# Patient Record
Sex: Male | Born: 1965 | Race: Black or African American | Hispanic: No | Marital: Single | Smoking: Current every day smoker
Health system: Southern US, Community
[De-identification: ages and names within clinical notes are randomized; demographics above are authoritative.]

## PROBLEM LIST (undated history)

## (undated) DIAGNOSIS — S0990XA Unspecified injury of head, initial encounter: Secondary | ICD-10-CM

## (undated) DIAGNOSIS — I10 Essential (primary) hypertension: Secondary | ICD-10-CM

## (undated) DIAGNOSIS — K219 Gastro-esophageal reflux disease without esophagitis: Secondary | ICD-10-CM

## (undated) HISTORY — DX: Essential (primary) hypertension: I10

## (undated) HISTORY — PX: HERNIA REPAIR: SHX51

## (undated) HISTORY — DX: Gastro-esophageal reflux disease without esophagitis: K21.9

## (undated) HISTORY — PX: TONSILLECTOMY: SUR1361

---

## 2005-07-16 ENCOUNTER — Inpatient Hospital Stay: Payer: Self-pay | Admitting: Surgery

## 2005-07-26 ENCOUNTER — Emergency Department: Payer: Self-pay | Admitting: Emergency Medicine

## 2007-07-13 ENCOUNTER — Emergency Department: Payer: Self-pay | Admitting: Emergency Medicine

## 2010-01-11 ENCOUNTER — Emergency Department (HOSPITAL_COMMUNITY): Admission: EM | Admit: 2010-01-11 | Discharge: 2010-01-11 | Payer: Self-pay | Admitting: Emergency Medicine

## 2010-11-24 ENCOUNTER — Emergency Department (HOSPITAL_COMMUNITY)
Admission: EM | Admit: 2010-11-24 | Discharge: 2010-11-24 | Payer: Self-pay | Source: Home / Self Care | Admitting: Emergency Medicine

## 2011-02-04 ENCOUNTER — Emergency Department (HOSPITAL_COMMUNITY)
Admission: EM | Admit: 2011-02-04 | Discharge: 2011-02-04 | Disposition: A | Discharge status: Home / Self Care | Payer: Self-pay | Attending: Emergency Medicine | Admitting: Emergency Medicine

## 2011-02-04 ENCOUNTER — Emergency Department (HOSPITAL_COMMUNITY): Payer: Self-pay

## 2011-02-04 DIAGNOSIS — S5000XA Contusion of unspecified elbow, initial encounter: Secondary | ICD-10-CM | POA: Insufficient documentation

## 2011-02-04 DIAGNOSIS — M25429 Effusion, unspecified elbow: Secondary | ICD-10-CM | POA: Insufficient documentation

## 2011-02-04 DIAGNOSIS — X58XXXA Exposure to other specified factors, initial encounter: Secondary | ICD-10-CM | POA: Insufficient documentation

## 2011-02-04 DIAGNOSIS — M25529 Pain in unspecified elbow: Secondary | ICD-10-CM | POA: Insufficient documentation

## 2011-02-04 DIAGNOSIS — Y929 Unspecified place or not applicable: Secondary | ICD-10-CM | POA: Insufficient documentation

## 2011-02-06 ENCOUNTER — Emergency Department (HOSPITAL_COMMUNITY)
Admission: EM | Admit: 2011-02-06 | Discharge: 2011-02-06 | Disposition: A | Discharge status: Home / Self Care | Payer: Medicaid Other | Attending: Emergency Medicine | Admitting: Emergency Medicine

## 2011-02-06 ENCOUNTER — Emergency Department (HOSPITAL_COMMUNITY): Payer: Medicaid Other

## 2011-02-06 DIAGNOSIS — R1032 Left lower quadrant pain: Secondary | ICD-10-CM | POA: Insufficient documentation

## 2011-02-06 DIAGNOSIS — R0789 Other chest pain: Secondary | ICD-10-CM | POA: Insufficient documentation

## 2011-02-06 DIAGNOSIS — Y92009 Unspecified place in unspecified non-institutional (private) residence as the place of occurrence of the external cause: Secondary | ICD-10-CM | POA: Insufficient documentation

## 2011-02-06 DIAGNOSIS — S301XXA Contusion of abdominal wall, initial encounter: Secondary | ICD-10-CM | POA: Insufficient documentation

## 2011-02-06 DIAGNOSIS — W010XXA Fall on same level from slipping, tripping and stumbling without subsequent striking against object, initial encounter: Secondary | ICD-10-CM | POA: Insufficient documentation

## 2011-02-22 ENCOUNTER — Other Ambulatory Visit: Payer: Self-pay | Admitting: General Surgery

## 2011-02-22 ENCOUNTER — Encounter (HOSPITAL_COMMUNITY): Payer: Medicaid Other

## 2011-02-22 DIAGNOSIS — Z01818 Encounter for other preprocedural examination: Secondary | ICD-10-CM | POA: Insufficient documentation

## 2011-02-22 DIAGNOSIS — Z01812 Encounter for preprocedural laboratory examination: Secondary | ICD-10-CM | POA: Insufficient documentation

## 2011-02-22 LAB — CBC
MCH: 30.3 pg (ref 26.0–34.0)
MCV: 90.8 fL (ref 78.0–100.0)
Platelets: 223 10*3/uL (ref 150–400)
RDW: 14.5 % (ref 11.5–15.5)

## 2011-02-22 LAB — BASIC METABOLIC PANEL
BUN: 7 mg/dL (ref 6–23)
CO2: 26 mEq/L (ref 19–32)
Chloride: 107 mEq/L (ref 96–112)
Glucose, Bld: 85 mg/dL (ref 70–99)
Potassium: 4.1 mEq/L (ref 3.5–5.1)

## 2011-02-22 LAB — DIFFERENTIAL
Eosinophils Absolute: 0.7 10*3/uL (ref 0.0–0.7)
Eosinophils Relative: 13 % — ABNORMAL HIGH (ref 0–5)
Lymphs Abs: 2.7 10*3/uL (ref 0.7–4.0)
Monocytes Relative: 6 % (ref 3–12)

## 2011-02-24 ENCOUNTER — Other Ambulatory Visit: Payer: Self-pay | Admitting: General Surgery

## 2011-02-24 ENCOUNTER — Inpatient Hospital Stay (HOSPITAL_COMMUNITY)
Admission: RE | Admit: 2011-02-24 | Discharge: 2011-02-25 | DRG: 352 | Disposition: A | Discharge status: Home / Self Care | Payer: Medicaid Other | Source: Ambulatory Visit | Attending: General Surgery | Admitting: General Surgery

## 2011-02-24 DIAGNOSIS — Z87820 Personal history of traumatic brain injury: Secondary | ICD-10-CM

## 2011-02-24 DIAGNOSIS — K4090 Unilateral inguinal hernia, without obstruction or gangrene, not specified as recurrent: Principal | ICD-10-CM | POA: Diagnosis present
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
## 2011-07-04 ENCOUNTER — Encounter: Payer: Self-pay | Admitting: *Deleted

## 2011-07-04 ENCOUNTER — Emergency Department (HOSPITAL_COMMUNITY)
Admission: EM | Admit: 2011-07-04 | Discharge: 2011-07-04 | Disposition: A | Discharge status: Home / Self Care | Payer: Medicaid Other | Attending: Emergency Medicine | Admitting: Emergency Medicine

## 2011-07-04 DIAGNOSIS — Z23 Encounter for immunization: Secondary | ICD-10-CM

## 2011-07-04 DIAGNOSIS — S1190XA Unspecified open wound of unspecified part of neck, initial encounter: Secondary | ICD-10-CM | POA: Insufficient documentation

## 2011-07-04 DIAGNOSIS — S335XXA Sprain of ligaments of lumbar spine, initial encounter: Secondary | ICD-10-CM | POA: Insufficient documentation

## 2011-07-04 DIAGNOSIS — F17200 Nicotine dependence, unspecified, uncomplicated: Secondary | ICD-10-CM | POA: Insufficient documentation

## 2011-07-04 MED ORDER — PREDNISONE 20 MG PO TABS: 60.0000 mg | ORAL_TABLET | Freq: Every day | ORAL | Status: AC

## 2011-07-04 MED ORDER — HYDROCODONE-ACETAMINOPHEN 5-325 MG PO TABS
1.0000 | ORAL_TABLET | Freq: Once | ORAL | Status: AC
  Administered 2011-07-04: 1 via ORAL
  Filled 2011-07-04: qty 1

## 2011-07-04 MED ORDER — PREDNISONE 20 MG PO TABS
60.0000 mg | ORAL_TABLET | Freq: Once | ORAL | Status: AC
  Administered 2011-07-04: 60 mg via ORAL
  Filled 2011-07-04: qty 3

## 2011-07-04 MED ORDER — HYDROCODONE-ACETAMINOPHEN 5-325 MG PO TABS: 1.0000 | ORAL_TABLET | ORAL | Status: AC | PRN

## 2011-07-04 MED ORDER — TETANUS-DIPHTH-ACELL PERTUSSIS 5-2.5-18.5 LF-MCG/0.5 IM SUSP
0.5000 mL | Freq: Once | INTRAMUSCULAR | Status: AC
  Administered 2011-07-04: 0.5 mL via INTRAMUSCULAR
  Filled 2011-07-04: qty 0.5
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
## 2011-10-19 ENCOUNTER — Emergency Department (HOSPITAL_COMMUNITY)
Admission: EM | Admit: 2011-10-19 | Discharge: 2011-10-19 | Disposition: A | Discharge status: Home / Self Care | Payer: Medicaid Other | Attending: Emergency Medicine | Admitting: Emergency Medicine

## 2011-10-19 ENCOUNTER — Encounter (HOSPITAL_COMMUNITY): Payer: Self-pay | Admitting: *Deleted

## 2011-10-19 ENCOUNTER — Emergency Department (HOSPITAL_COMMUNITY): Payer: Medicaid Other

## 2011-10-19 DIAGNOSIS — S01409A Unspecified open wound of unspecified cheek and temporomandibular area, initial encounter: Secondary | ICD-10-CM | POA: Insufficient documentation

## 2011-10-19 DIAGNOSIS — S022XXA Fracture of nasal bones, initial encounter for closed fracture: Secondary | ICD-10-CM | POA: Insufficient documentation

## 2011-10-19 DIAGNOSIS — S0100XA Unspecified open wound of scalp, initial encounter: Secondary | ICD-10-CM | POA: Insufficient documentation

## 2011-10-19 DIAGNOSIS — F17200 Nicotine dependence, unspecified, uncomplicated: Secondary | ICD-10-CM | POA: Insufficient documentation

## 2011-10-19 DIAGNOSIS — S1093XA Contusion of unspecified part of neck, initial encounter: Secondary | ICD-10-CM | POA: Insufficient documentation

## 2011-10-19 DIAGNOSIS — S0003XA Contusion of scalp, initial encounter: Secondary | ICD-10-CM | POA: Insufficient documentation

## 2011-10-19 HISTORY — DX: Unspecified injury of head, initial encounter: S09.90XA

## 2011-10-19 MED ORDER — LIDOCAINE-EPINEPHRINE (PF) 2 %-1:200000 IJ SOLN
INTRAMUSCULAR | Status: AC
  Administered 2011-10-19: 22:00:00
  Filled 2011-10-19: qty 20

## 2011-10-19 MED ORDER — HYDROCODONE-ACETAMINOPHEN 5-325 MG PO TABS: 2.0000 | ORAL_TABLET | ORAL | Status: AC | PRN

## 2011-10-19 NOTE — ED Notes (Signed)
Pt left the er stating no needs 
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
## 2011-10-28 ENCOUNTER — Encounter (HOSPITAL_COMMUNITY): Payer: Self-pay | Admitting: *Deleted

## 2011-10-28 ENCOUNTER — Emergency Department (HOSPITAL_COMMUNITY)
Admission: EM | Admit: 2011-10-28 | Discharge: 2011-10-28 | Disposition: A | Discharge status: Home / Self Care | Payer: Medicaid Other | Attending: Emergency Medicine | Admitting: Emergency Medicine

## 2011-10-28 DIAGNOSIS — Z4802 Encounter for removal of sutures: Secondary | ICD-10-CM | POA: Insufficient documentation

## 2011-10-28 NOTE — ED Notes (Signed)
Pt alert and oriented x 4 and respirations even and unlabored.  Discharge instructions reviewed with patient and patient verbalized understanding.  Pt ambulated with steady gait to POV.  No acute distress noted.

## 2011-10-28 NOTE — ED Provider Notes (Signed)
History     CSN: [id number] Arrival date & time: 10/28/2011  8:11 AM   None     Chief Complaint  Patient presents with  . Suture / Staple Removal    (Consider location/radiation/quality/duration/timing/severity/associated sxs/prior treatment) Patient is a 45 y.o. male presenting with suture removal. The history is provided by the patient. No language interpreter was used.  Suture / Staple Removal  The sutures were placed 7 to 10 days ago. Treatments since wound repair include regular soap and water washings. His temperature was unmeasured prior to arrival. There has been no drainage from the wound. There is no redness present. There is no swelling present. The pain has no pain.    Past Medical History  Diagnosis Date  . Head injury     Past Surgical History  Procedure Date  . Hernia repair     History reviewed. No pertinent family history.  History  Substance Use Topics  . Smoking status: Current Everyday Smoker -- 0.5 packs/day for 10 years  . Smokeless tobacco: Not on file  . Alcohol Use: Yes     occasional per pt      Review of Systems  Skin: Positive for wound.  All other systems reviewed and are negative.    Allergies  Review of patient's allergies indicates no known allergies.  Home Medications   Current Outpatient Rx  Name Route Sig Dispense Refill  . HYDROCODONE-ACETAMINOPHEN 5-325 MG PO TABS Oral Take 2 tablets by mouth every 4 (four) hours as needed for pain. 10 tablet 0  . IBUPROFEN 800 MG PO TABS Oral Take 800 mg by mouth every 8 (eight) hours as needed. For pain       BP 131/81  Pulse 66  Temp(Src) 97.9 F (36.6 C) (Oral)  Resp 16  Ht 5\' 6"  (1.676 m)  Wt 175 lb (79.379 kg)  BMI 28.25 kg/m2  SpO2 99%  Physical Exam  Nursing note and vitals reviewed. Constitutional: He is oriented to person, place, and time. He appears well-developed and well-nourished. He is cooperative.  HENT:  Head: Normocephalic.    Eyes: EOM are normal.    Neck: Normal range of motion.  Cardiovascular: Normal rate, regular rhythm, normal heart sounds and intact distal pulses.   Pulmonary/Chest: Effort normal and breath sounds normal. No respiratory distress.  Abdominal: Soft. He exhibits no distension. There is no tenderness.  Musculoskeletal: Normal range of motion.  Neurological: He is alert and oriented to person, place, and time. He has normal strength. No cranial nerve deficit or sensory deficit. GCS eye subscore is 4. GCS verbal subscore is 5. GCS motor subscore is 6.  Skin: Skin is warm and dry.  Psychiatric: He has a normal mood and affect. Judgment normal.    ED Course  Procedures (including critical care time)  Labs Reviewed - No data to display No results found.   No diagnosis found.    MDM          Worthy Rancher, PA 10/28/11 [phone]

## 2011-10-28 NOTE — ED Notes (Signed)
Pt needs staples removed from his head. States that they were placed on Nov. 1, 2012.

## 2011-10-29 NOTE — ED Provider Notes (Signed)
Medical screening examination/treatment/procedure(s) were performed by non-physician practitioner and as supervising physician I was immediately available for consultation/collaboration.   Keondra Haydu, MD 10/29/11 0753 

## 2012-03-29 ENCOUNTER — Encounter (HOSPITAL_COMMUNITY): Payer: Self-pay | Admitting: Emergency Medicine

## 2012-03-29 ENCOUNTER — Emergency Department (HOSPITAL_COMMUNITY)
Admission: EM | Admit: 2012-03-29 | Discharge: 2012-03-29 | Disposition: A | Discharge status: Home / Self Care | Payer: Medicaid Other | Attending: Emergency Medicine | Admitting: Emergency Medicine

## 2012-03-29 DIAGNOSIS — IMO0002 Reserved for concepts with insufficient information to code with codable children: Secondary | ICD-10-CM | POA: Insufficient documentation

## 2012-03-29 DIAGNOSIS — H5789 Other specified disorders of eye and adnexa: Secondary | ICD-10-CM | POA: Insufficient documentation

## 2012-03-29 DIAGNOSIS — F17200 Nicotine dependence, unspecified, uncomplicated: Secondary | ICD-10-CM | POA: Insufficient documentation

## 2012-03-29 DIAGNOSIS — H5710 Ocular pain, unspecified eye: Secondary | ICD-10-CM | POA: Insufficient documentation

## 2012-03-29 DIAGNOSIS — T2610XA Burn of cornea and conjunctival sac, unspecified eye, initial encounter: Secondary | ICD-10-CM | POA: Insufficient documentation

## 2012-03-29 DIAGNOSIS — T2660XA Corrosion of cornea and conjunctival sac, unspecified eye, initial encounter: Secondary | ICD-10-CM

## 2012-03-29 MED ORDER — ERYTHROMYCIN 5 MG/GM OP OINT
TOPICAL_OINTMENT | Freq: Once | OPHTHALMIC | Status: AC
  Administered 2012-03-29: 14:00:00 via OPHTHALMIC
  Filled 2012-03-29: qty 3.5

## 2012-03-29 MED ORDER — KETOROLAC TROMETHAMINE 0.5 % OP SOLN
1.0000 [drp] | Freq: Once | OPHTHALMIC | Status: AC
  Administered 2012-03-29: 1 [drp] via OPHTHALMIC
  Filled 2012-03-29: qty 5

## 2012-03-29 MED ORDER — FLUORESCEIN SODIUM 1 MG OP STRP
ORAL_STRIP | OPHTHALMIC | Status: AC
  Administered 2012-03-29: 12:00:00
  Filled 2012-03-29: qty 2

## 2012-03-29 MED ORDER — PREDNISOLONE ACETATE 1 % OP SUSP
1.0000 [drp] | Freq: Once | OPHTHALMIC | Status: AC
  Administered 2012-03-29: 1 [drp] via OPHTHALMIC
  Filled 2012-03-29: qty 1

## 2012-03-29 MED ORDER — TETRACAINE HCL 0.5 % OP SOLN
OPHTHALMIC | Status: AC
  Administered 2012-03-29: 12:00:00 via OPHTHALMIC
  Filled 2012-03-29: qty 2

## 2012-03-29 NOTE — Discharge Instructions (Signed)
RESOURCE GUIDE  Dental Problems  Patients with Medicaid: [facility]                     [facility] [address]                                           [address] Phone:  [phone]                                                  Phone:  [phone]  If unable to pay or uninsured, contact:  [facility] or [facility] to become qualified for the adult dental clinic.  Chronic Pain Problems Contact [facility]  [phone] Patients need to be referred by their primary care doctor.  Insufficient Money for Medicine Contact [employer]:  call "211" or [facility] 271-5999.  No Primary Care Doctor Call [facility]  832-8000 Other agencies that provide inexpensive medical care    [facility]  832-8035    [facility]  832-7272    [facility]  [phone]    Women's Clinic  832-4777    [facility]  [phone]    [facility]  [phone]  Psychological Services [facility]  [phone] [facility]  [phone] [facility]   800 853-5163 (emergency services 641-4993)  Substance Abuse Resources [facility]  336-882-2125 [facility] 336-784-9470 [facility] 336-285-9073 [facility] 336-845-3988 Residential & Outpatient Substance Abuse Program  800-659-3381  Abuse/Neglect [place] Child Abuse Hotline (336) 641-3795 [place] Child Abuse Hotline 800-378-5315 (After Hours)  Emergency Shelter [facility] (336) 271-5985  Maternity Homes [facility] (336) 275-9566 [facility] (704) 372-4663  MRSA Hotline #:   832-7006    [place] Resources  [facility] [place]     [employer]                          [facility] [address] [place]                       [address]      [address]  [place]                                                [place]                            [place] Phone:  [phone]                                   Phone:  [phone]                 Phone:  [phone]  [facility] Phone:  [phone]    [facility] Child Abuse Hotline (904)717-1004 364-616-2777 (After Hours)   Take the erythromycin eye ointment: 1 thin strip to left eye 4 times per day.  Take the ketoralac eye drops:  1 drop in your left eye 4 times per day.  Take the pred forte eye drops:  1 drop in you left eye 4 times per day.  Call the eye doctor today to confirm you will be seen in the office in follow up on Monday at 4pm.  It is VERY IMPORTANT you keep this appointment and take the medicines a prescribed to help your eye injury heal correctly.  Return to the Emergency Department immediately sooner if worsening.

## 2012-03-29 NOTE — ED Notes (Signed)
Pt c/o eye drainage, pain and redness since Tuesday.

## 2012-03-29 NOTE — ED Notes (Signed)
MD at bedside. 
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
## 2012-03-29 NOTE — ED Notes (Signed)
Discharge instructions reviewed with pt, questions answered. Pt verbalized understanding.  

## 2012-04-19 ENCOUNTER — Encounter (HOSPITAL_COMMUNITY): Payer: Self-pay | Admitting: *Deleted

## 2012-04-19 ENCOUNTER — Emergency Department (HOSPITAL_COMMUNITY): Payer: Medicaid Other

## 2012-04-19 ENCOUNTER — Emergency Department (HOSPITAL_COMMUNITY)
Admission: EM | Admit: 2012-04-19 | Discharge: 2012-04-19 | Disposition: A | Discharge status: Home / Self Care | Payer: Medicaid Other | Attending: Emergency Medicine | Admitting: Emergency Medicine

## 2012-04-19 DIAGNOSIS — M7989 Other specified soft tissue disorders: Secondary | ICD-10-CM | POA: Insufficient documentation

## 2012-04-19 DIAGNOSIS — S5000XA Contusion of unspecified elbow, initial encounter: Secondary | ICD-10-CM | POA: Insufficient documentation

## 2012-04-19 DIAGNOSIS — S5002XA Contusion of left elbow, initial encounter: Secondary | ICD-10-CM

## 2012-04-19 MED ORDER — IBUPROFEN 800 MG PO TABS
800.0000 mg | ORAL_TABLET | Freq: Once | ORAL | Status: AC
  Administered 2012-04-19: 800 mg via ORAL
  Filled 2012-04-19: qty 1
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
## 2012-04-19 NOTE — Discharge Instructions (Signed)
Contusion A contusion is a deep bruise. Contusions happen when an injury causes bleeding under the skin. Signs of bruising include pain, puffiness (swelling), and discolored skin. The contusion may turn blue, purple, or yellow. HOME CARE   Put ice on the injured area.   Put ice in a plastic bag.   Place a towel between your skin and the bag.   Leave the ice on for 15 to 20 minutes, 3 to 4 times a day.   Only take medicine as told by your doctor.   Rest the injured area.   If possible, raise (elevate) the injured area to lessen puffiness.  GET HELP RIGHT AWAY IF:   You have more bruising or puffiness.   You have pain that is getting worse.   Your puffiness or pain is not helped by medicine.  MAKE SURE YOU:   Understand these instructions.   Will watch your condition.   Will get help right away if you are not doing well or get worse.  Document Released: 05/22/2008 Document Revised: 11/23/2011 Document Reviewed: 10/09/2011 [facility] Patient Information 2012 [place], [place].Cryotherapy Cryotherapy means treatment with cold. Ice or gel packs can be used to reduce both pain and swelling. Ice is the most helpful within the first 24 to 48 hours after an injury or flareup from overusing a muscle or joint. Sprains, strains, spasms, burning pain, shooting pain, and aches can all be eased with ice. Ice can also be used when recovering from surgery. Ice is effective, has very few side effects, and is safe for most people to use. PRECAUTIONS  Ice is not a safe treatment option for people with:  Raynaud's phenomenon. This is a condition affecting small blood vessels in the extremities. Exposure to cold may cause your problems to return.   Cold hypersensitivity. There are many forms of cold hypersensitivity, including:   Cold urticaria. Red, itchy hives appear on the skin when the tissues begin to warm after being iced.   Cold erythema. This is a red, itchy rash caused by exposure to cold.     Cold hemoglobinuria. Red blood cells break down when the tissues begin to warm after being iced. The hemoglobin that carry oxygen are passed into the urine because they cannot combine with blood proteins fast enough.   Numbness or altered sensitivity in the area being iced.  If you have any of the following conditions, do not use ice until you have discussed cryotherapy with your caregiver:  Heart conditions, such as arrhythmia, angina, or chronic heart disease.   High blood pressure.   Healing wounds or open skin in the area being iced.   Current infections.   Rheumatoid arthritis.   Poor circulation.   Diabetes.  Ice slows the blood flow in the region it is applied. This is beneficial when trying to stop inflamed tissues from spreading irritating chemicals to surrounding tissues. However, if you expose your skin to cold temperatures for too long or without the proper protection, you can damage your skin or nerves. Watch for signs of skin damage due to cold. HOME CARE INSTRUCTIONS Follow these tips to use ice and cold packs safely.  Place a dry or damp towel between the ice and skin. A damp towel will cool the skin more quickly, so you may need to shorten the time that the ice is used.   For a more rapid response, add gentle compression to the ice.   Ice for no more than 10 to 20 minutes at  a time. The bonier the area you are icing, the less time it will take to get the benefits of ice.   Check your skin after 5 minutes to make sure there are no signs of a poor response to cold or skin damage.   Rest 20 minutes or more in between uses.   Once your skin is numb, you can end your treatment. You can test numbness by very lightly touching your skin. The touch should be so light that you do not see the skin dimple from the pressure of your fingertip. When using ice, most people will feel these normal sensations in this order: cold, burning, aching, and numbness.   Do not use ice on  someone who cannot communicate their responses to pain, such as small children or people with dementia.  HOW TO MAKE AN ICE PACK Ice packs are the most common way to use ice therapy. Other methods include ice massage, ice baths, and cryo-sprays. Muscle creams that cause a cold, tingly feeling do not offer the same benefits that ice offers and should not be used as a substitute unless recommended by your caregiver. To make an ice pack, do one of the following:  Place crushed ice or a bag of frozen vegetables in a sealable plastic bag. Squeeze out the excess air. Place this bag inside another plastic bag. Slide the bag into a pillowcase or place a damp towel between your skin and the bag.   Mix 3 parts water with 1 part rubbing alcohol. Freeze the mixture in a sealable plastic bag. When you remove the mixture from the freezer, it will be slushy. Squeeze out the excess air. Place this bag inside another plastic bag. Slide the bag into a pillowcase or place a damp towel between your skin and the bag.  SEEK MEDICAL CARE IF:  You develop white spots on your skin. This may give the skin a blotchy (mottled) appearance.   Your skin turns blue or pale.   Your skin becomes waxy or hard.   Your swelling gets worse.  MAKE SURE YOU:   Understand these instructions.   Will watch your condition.   Will get help right away if you are not doing well or get worse.  Document Released: 07/31/2011 Document Revised: 11/23/2011 Document Reviewed: 07/31/2011 [facility] Patient Information 2012 [place], [place].  Take ibuprofen 800 mg every 8 hrs with food.  Wear the sling for comfort.  Apply ice several times daily.  Follow up with either of the orthopedists as needed.
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
## 2012-04-20 NOTE — ED Provider Notes (Signed)
Medical screening examination/treatment/procedure(s) were performed by non-physician practitioner and as supervising physician I was immediately available for consultation/collaboration.   Joya Gaskins, MD 04/20/12 [phone]
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
## 2013-03-07 ENCOUNTER — Emergency Department (HOSPITAL_COMMUNITY)
Admission: EM | Admit: 2013-03-07 | Discharge: 2013-03-07 | Disposition: A | Discharge status: Home / Self Care | Payer: Medicaid Other | Attending: Emergency Medicine | Admitting: Emergency Medicine

## 2013-03-07 ENCOUNTER — Encounter (HOSPITAL_COMMUNITY): Payer: Self-pay | Admitting: *Deleted

## 2013-03-07 DIAGNOSIS — K029 Dental caries, unspecified: Secondary | ICD-10-CM | POA: Insufficient documentation

## 2013-03-07 DIAGNOSIS — F17200 Nicotine dependence, unspecified, uncomplicated: Secondary | ICD-10-CM | POA: Insufficient documentation

## 2013-03-07 DIAGNOSIS — R05 Cough: Secondary | ICD-10-CM | POA: Insufficient documentation

## 2013-03-07 DIAGNOSIS — R599 Enlarged lymph nodes, unspecified: Secondary | ICD-10-CM | POA: Insufficient documentation

## 2013-03-07 DIAGNOSIS — K047 Periapical abscess without sinus: Secondary | ICD-10-CM | POA: Insufficient documentation

## 2013-03-07 DIAGNOSIS — Z8781 Personal history of (healed) traumatic fracture: Secondary | ICD-10-CM | POA: Insufficient documentation

## 2013-03-07 DIAGNOSIS — R059 Cough, unspecified: Secondary | ICD-10-CM | POA: Insufficient documentation

## 2013-03-07 MED ORDER — IBUPROFEN 600 MG PO TABS: 600.0000 mg | ORAL_TABLET | Freq: Four times a day (QID) | ORAL | Status: DC | PRN

## 2013-03-07 MED ORDER — TRAMADOL HCL 50 MG PO TABS: 50.0000 mg | ORAL_TABLET | Freq: Four times a day (QID) | ORAL | Status: DC | PRN

## 2013-03-07 MED ORDER — AMOXICILLIN 500 MG PO CAPS: 500.0000 mg | ORAL_CAPSULE | Freq: Three times a day (TID) | ORAL | Status: DC

## 2013-03-07 NOTE — ED Provider Notes (Signed)
Medical screening examination/treatment/procedure(s) were performed by non-physician practitioner and as supervising physician I was immediately available for consultation/collaboration.  Gloria Ricardo M Mikylah Ackroyd, MD 03/07/13 2138 
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
## 2013-03-07 NOTE — ED Notes (Signed)
Instructions, prescriptions and f/u information for dental resources provided/reviewed; verbalizes understanding.

## 2013-03-07 NOTE — ED Notes (Signed)
Dental abscess to left jaw x 2 days.

## 2013-03-10 ENCOUNTER — Emergency Department (HOSPITAL_COMMUNITY)
Admission: EM | Admit: 2013-03-10 | Discharge: 2013-03-10 | Disposition: A | Discharge status: Home / Self Care | Payer: Medicaid Other | Attending: Emergency Medicine | Admitting: Emergency Medicine

## 2013-03-10 ENCOUNTER — Encounter (HOSPITAL_COMMUNITY): Payer: Self-pay | Admitting: *Deleted

## 2013-03-10 DIAGNOSIS — Z87828 Personal history of other (healed) physical injury and trauma: Secondary | ICD-10-CM | POA: Insufficient documentation

## 2013-03-10 DIAGNOSIS — F17200 Nicotine dependence, unspecified, uncomplicated: Secondary | ICD-10-CM | POA: Insufficient documentation

## 2013-03-10 DIAGNOSIS — R21 Rash and other nonspecific skin eruption: Secondary | ICD-10-CM | POA: Insufficient documentation

## 2013-03-10 DIAGNOSIS — Z789 Other specified health status: Secondary | ICD-10-CM

## 2013-03-10 DIAGNOSIS — K029 Dental caries, unspecified: Secondary | ICD-10-CM | POA: Insufficient documentation

## 2013-03-10 DIAGNOSIS — T394X5A Adverse effect of antirheumatics, not elsewhere classified, initial encounter: Secondary | ICD-10-CM | POA: Insufficient documentation

## 2013-03-10 DIAGNOSIS — T360X5A Adverse effect of penicillins, initial encounter: Secondary | ICD-10-CM | POA: Insufficient documentation

## 2013-03-10 DIAGNOSIS — T3995XA Adverse effect of unspecified nonopioid analgesic, antipyretic and antirheumatic, initial encounter: Secondary | ICD-10-CM | POA: Insufficient documentation

## 2013-03-10 DIAGNOSIS — R220 Localized swelling, mass and lump, head: Secondary | ICD-10-CM | POA: Insufficient documentation

## 2013-03-10 DIAGNOSIS — R221 Localized swelling, mass and lump, neck: Secondary | ICD-10-CM | POA: Insufficient documentation

## 2013-03-10 MED ORDER — HYDROCODONE-ACETAMINOPHEN 5-325 MG PO TABS: ORAL_TABLET | ORAL | Status: DC

## 2013-03-10 MED ORDER — DIPHENHYDRAMINE HCL 25 MG PO CAPS: 25.0000 mg | ORAL_CAPSULE | ORAL | Status: DC | PRN

## 2013-03-10 MED ORDER — DIPHENHYDRAMINE HCL 25 MG PO CAPS
25.0000 mg | ORAL_CAPSULE | Freq: Once | ORAL | Status: AC
  Administered 2013-03-10: 25 mg via ORAL
  Filled 2013-03-10: qty 1

## 2013-03-10 MED ORDER — CLINDAMYCIN HCL 300 MG PO CAPS: 300.0000 mg | ORAL_CAPSULE | Freq: Four times a day (QID) | ORAL | Status: DC

## 2013-03-10 NOTE — ED Notes (Signed)
Pt was seen here for dental problem and started antibiotic and pain meds. Now reports itching rash . Seen here 3/21.

## 2013-03-10 NOTE — ED Notes (Signed)
Seen here x 4 days ago, received rx for abx and pain.  States started having allergic reaction with rash to back, arms, and face, with swelling x 2 days ago.  Denies difficulty breathing.

## 2013-03-10 NOTE — ED Provider Notes (Signed)
History     CSN: [id number]  Arrival date & time 03/10/13  1110   First MD Initiated Contact with Patient 03/10/13 1133      Chief Complaint  Patient presents with  . Allergic Reaction    (Consider location/radiation/quality/duration/timing/severity/associated sxs/prior treatment) HPI Comments: Patient c/o red, itchy rash that began two days after starting amoxil, ultram and ibuprofen.  He was seen here and treated with those medications for dental pain.  He also states that he has noticed some swelling ot his face.  He denies any swelling of his lips or tongue.  He also denies difficulty swallowing or breathing.  He stopped taking the medication last evening.  He denies taking any Benadryl or applying any OTC anti-itch creams  Patient is a 47 y.o. male presenting with rash. The history is provided by the patient.  Rash Location:  Face, shoulder/arm and torso Facial rash location:  Face Shoulder/arm rash location:  L upper arm and R upper arm Torso rash location:  Upper back Quality: itchiness, redness and swelling   Quality: not blistering, not draining, not painful and not peeling   Severity:  Mild Onset quality:  Gradual Duration:  2 days Timing:  Constant Progression:  Unchanged Chronicity:  New Context: medications   Relieved by:  Nothing Worsened by:  Nothing tried Ineffective treatments:  None tried Associated symptoms: no fever, no headaches, no hoarse voice, no joint pain, no nausea, no periorbital edema, no shortness of breath, no sore throat, no throat swelling, no tongue swelling, not vomiting and not wheezing     Past Medical History  Diagnosis Date  . Head injury     Past Surgical History  Procedure Laterality Date  . Hernia repair      No family history on file.  History  Substance Use Topics  . Smoking status: Current Every Day Smoker -- 0.50 packs/day for 10 years    Types: Cigarettes  . Smokeless tobacco: Not on file  . Alcohol Use: Yes   Comment: occasional per pt      Review of Systems  Constitutional: Negative for fever, chills, activity change and appetite change.  HENT: Positive for facial swelling and dental problem. Negative for congestion, sore throat, hoarse voice, trouble swallowing, neck pain and neck stiffness.   Eyes: Negative for visual disturbance.  Respiratory: Negative for chest tightness, shortness of breath and wheezing.   Cardiovascular: Negative for chest pain and palpitations.  Gastrointestinal: Negative for nausea and vomiting.  Musculoskeletal: Negative for arthralgias.  Skin: Positive for rash. Negative for wound.  Neurological: Negative for dizziness, facial asymmetry, weakness, numbness and headaches.  All other systems reviewed and are negative.    Allergies  Review of patient's allergies indicates no known allergies.  Home Medications   Current Outpatient Rx  Name  Route  Sig  Dispense  Refill  . amoxicillin (AMOXIL) 500 MG capsule   Oral   Take 1 capsule (500 mg total) by mouth 3 (three) times daily.   21 capsule   0   . ibuprofen (ADVIL,MOTRIN) 600 MG tablet   Oral   Take 1 tablet (600 mg total) by mouth every 6 (six) hours as needed for pain.   30 tablet   0   . traMADol (ULTRAM) 50 MG tablet   Oral   Take 1 tablet (50 mg total) by mouth every 6 (six) hours as needed for pain.   15 tablet   0     BP 125/78  Pulse 85  Temp(Src) 98.2 F (36.8 C) (Oral)  Resp 20  Ht 5\' 6"  (1.676 m)  Wt 175 lb (79.379 kg)  BMI 28.26 kg/m2  SpO2 100%  Physical Exam  Nursing note and vitals reviewed. Constitutional: He is oriented to person, place, and time. He appears well-developed and well-nourished. No distress.  HENT:  Head: Normocephalic and atraumatic.  Mouth/Throat: Oropharynx is clear and moist.  Multiple dental caries.  No obvious dental abscess. No trismus, no angioedema or noticeable facial edema  Eyes: Conjunctivae and EOM are normal. Pupils are equal, round, and  reactive to light.  Neck: Normal range of motion. Neck supple.  Cardiovascular: Normal rate, regular rhythm, normal heart sounds and intact distal pulses.   No murmur heard. Pulmonary/Chest: Effort normal and breath sounds normal. No respiratory distress. He has no wheezes.  Musculoskeletal: He exhibits no edema and no tenderness.  Lymphadenopathy:    He has no cervical adenopathy.  Neurological: He is alert and oriented to person, place, and time. He exhibits normal muscle tone. Coordination normal.  Skin: Rash noted. There is erythema.  two patches of erythema of the upper back and slight erythema of the face. Mild excoriation to the bilateral forearms and upper back.   No hives    ED Course  Procedures (including critical care time)  Labs Reviewed - No data to display No results found.      MDM    Previous ED chart reviewed.  Will have pt d/c the ibuprofen, amoxil and tramadol.  No palpable rash or hives at this time.  Airway patent, no facial edema.  Appears stable for discharge.  Patient has dental f/u and multiple dental caries, so I will prescribe clindamycin and benadryl. Patient also requesting something for dental pain, so #12 norco also prescribed.  He agrees to return here if the sx's worsen        Orange Hilligoss L. Jefrey Raburn, PA-C 03/12/13 1332

## 2013-03-13 NOTE — ED Provider Notes (Signed)
Medical screening examination/treatment/procedure(s) were performed by non-physician practitioner and as supervising physician I was immediately available for consultation/collaboration.  Christopher J. Pollina, MD 03/13/13 0705 

## 2019-10-28 ENCOUNTER — Other Ambulatory Visit: Payer: Self-pay

## 2019-10-28 DIAGNOSIS — Z20822 Contact with and (suspected) exposure to covid-19: Secondary | ICD-10-CM

## 2019-10-31 ENCOUNTER — Ambulatory Visit: Payer: Self-pay

## 2019-10-31 LAB — NOVEL CORONAVIRUS, NAA: SARS-CoV-2, NAA: NOT DETECTED

## 2019-10-31 NOTE — Telephone Encounter (Signed)
Provided covided -19 test results .   Provided care advice voiced understanding.

## 2021-05-25 ENCOUNTER — Encounter: Payer: Self-pay | Admitting: *Deleted

## 2021-06-30 ENCOUNTER — Other Ambulatory Visit: Payer: Self-pay

## 2021-06-30 ENCOUNTER — Ambulatory Visit: Payer: Medicaid Other | Admitting: Internal Medicine

## 2021-06-30 ENCOUNTER — Encounter: Payer: Self-pay | Admitting: *Deleted

## 2021-06-30 ENCOUNTER — Encounter: Payer: Self-pay | Admitting: Internal Medicine

## 2021-06-30 VITALS — BP 118/74 | HR 64 | Temp 97.3°F | Ht 66.0 in | Wt 191.4 lb

## 2021-06-30 DIAGNOSIS — Z789 Other specified health status: Secondary | ICD-10-CM

## 2021-06-30 DIAGNOSIS — Z1211 Encounter for screening for malignant neoplasm of colon: Secondary | ICD-10-CM
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
## 2021-06-30 NOTE — Patient Instructions (Signed)
We will schedule you for colonoscopy for colon cancer screening purposes today in clinic.  Further recommendations to follow.  It was nice meeting you today.  At [facility] we value your feedback. You may receive a survey about your visit today. Please share your experience as we strive to create trusting relationships with our patients to provide genuine, compassionate, quality care.  We appreciate your understanding and patience as we review any laboratory studies, imaging, and other diagnostic tests that are ordered as we care for you. Our office policy is 5 business days for review of these results, and any emergent or urgent results are addressed in a timely manner for your best interest. If you do not hear from our office in 1 week, please contact us.   We also encourage the use of MyChart, which contains your medical information for your review as well. If you are not enrolled in this feature, an access code is on this after visit summary for your convenience. Thank you for allowing [place] to be involved in your care.  It was great to see you today!  I hope you have a great rest of your summer!!    Hennie Duos. Marletta Lor, D.O. Gastroenterology and Hepatology [facility]

## 2021-07-01 ENCOUNTER — Telehealth: Payer: Self-pay | Admitting: *Deleted

## 2021-07-01 MED ORDER — CLENPIQ 10-3.5-12 MG-GM -GM/160ML PO SOLN: 1.0000 | Freq: Once | ORAL | 0 refills | Status: AC

## 2021-07-01 NOTE — Telephone Encounter (Signed)
Called pt. He has been scheduled for 8/8 at 7:30am. Aware will need pre-op appt prior. Advised will mail prep instructions. Confirmed address. Confirmed pharmacy. Aware Rx will be sent.

## 2021-07-04 ENCOUNTER — Encounter: Payer: Self-pay | Admitting: *Deleted

## 2021-07-11 ENCOUNTER — Encounter: Payer: Self-pay | Admitting: Internal Medicine

## 2021-07-11 ENCOUNTER — Telehealth: Payer: Self-pay | Admitting: Internal Medicine

## 2021-07-11 MED ORDER — CLENPIQ 10-3.5-12 MG-GM -GM/160ML PO SOLN: 1.0000 | Freq: Once | ORAL | 0 refills | Status: DC

## 2021-07-11 NOTE — Addendum Note (Signed)
Addended by: Armstead Peaks on: 07/11/2021 02:45 PM   Modules accepted: Orders

## 2021-07-11 NOTE — Addendum Note (Signed)
Addended by: Armstead Peaks on: 07/11/2021 02:57 PM   Modules accepted: Orders

## 2021-07-11 NOTE — Addendum Note (Signed)
Addended by: Armstead Peaks on: 07/11/2021 04:31 PM   Modules accepted: Orders

## 2021-07-11 NOTE — Telephone Encounter (Signed)
PLEASE MAKE SURE PATIENT PREP IS SENT TO [facility] IN [place]

## 2021-07-12 MED ORDER — CLENPIQ 10-3.5-12 MG-GM -GM/160ML PO SOLN: 1.0000 | Freq: Once | ORAL | 0 refills | Status: DC

## 2021-07-12 MED ORDER — CLENPIQ 10-3.5-12 MG-GM -GM/160ML PO SOLN: 1.0000 | Freq: Once | ORAL | 0 refills | Status: AC

## 2021-07-12 NOTE — Addendum Note (Signed)
Addended by: Armstead Peaks on: 07/12/2021 07:45 AM   Modules accepted: Orders

## 2021-07-12 NOTE — Telephone Encounter (Signed)
Rx faxed to pharmacy  

## 2021-07-18 NOTE — Patient Instructions (Signed)
Shane West  07/18/2021     @PREFPERIOPPHARMACY @   Your procedure is scheduled on  07/25/2021.   Report to 09/24/2021 at  [phone] A.M.   Call this number if you have problems the morning of surgery:  [phone]   Remember:  Follow the diet and prep instructions given to you by the office.    Take these medicines the morning of surgery with A SIP OF WATER                                         None     Do not wear jewelry, make-up or nail polish.  Do not wear lotions, powders, or perfumes, or deodorant.  Do not shave 48 hours prior to surgery.  Men may shave face and neck.  Do not bring valuables to the hospital.  [facility] is not responsible for any belongings or valuables.  Contacts, dentures or bridgework may not be worn into surgery.  Leave your suitcase in the car.  After surgery it may be brought to your room.  For patients admitted to the hospital, discharge time will be determined by your treatment team.  Patients discharged the day of surgery will not be allowed to drive home and must have someone with them for 24 hours.    Special instructions:    DO NOT smoke tobacco or vape for 24 hours before your procedure.  Please read over the following fact sheets that you were given. Anesthesia Post-op Instructions and Care and Recovery After Surgery      Colonoscopy, Adult, Care After This sheet gives you information about how to care for yourself after your procedure. Your health care provider may also give you more specific instructions. If you have problems or questions, contact your health careprovider. What can I expect after the procedure? After the procedure, it is common to have: A small amount of blood in your stool for 24 hours after the procedure. Some gas. Mild cramping or bloating of your abdomen. Follow these instructions at home: Eating and drinking  Drink enough fluid to keep your urine pale yellow. Follow instructions from your  health care provider about eating or drinking restrictions. Resume your normal diet as instructed by your health care provider. Avoid heavy or fried foods that are hard to digest.  Activity Rest as told by your health care provider. Avoid sitting for a long time without moving. Get up to take short walks every 1-2 hours. This is important to improve blood flow and breathing. Ask for help if you feel weak or unsteady. Return to your normal activities as told by your health care provider. Ask your health care provider what activities are safe for you. Managing cramping and bloating  Try walking around when you have cramps or feel bloated. Apply heat to your abdomen as told by your health care provider. Use the heat source that your health care provider recommends, such as a moist heat pack or a heating pad. Place a towel between your skin and the heat source. Leave the heat on for 20-30 minutes. Remove the heat if your skin turns bright red. This is especially important if you are unable to feel pain, heat, or cold. You may have a greater risk of getting burned.  General instructions If you were given a sedative during the procedure, it can affect  you for several hours. Do not drive or operate machinery until your health care provider says that it is safe. For the first 24 hours after the procedure: Do not sign important documents. Do not drink alcohol. Do your regular daily activities at a slower pace than normal. Eat soft foods that are easy to digest. Take over-the-counter and prescription medicines only as told by your health care provider. Keep all follow-up visits as told by your health care provider. This is important. Contact a health care provider if: You have blood in your stool 2-3 days after the procedure. Get help right away if you have: More than a small spotting of blood in your stool. Large blood clots in your stool. Swelling of your abdomen. Nausea or vomiting. A  fever. Increasing pain in your abdomen that is not relieved with medicine. Summary After the procedure, it is common to have a small amount of blood in your stool. You may also have mild cramping and bloating of your abdomen. If you were given a sedative during the procedure, it can affect you for several hours. Do not drive or operate machinery until your health care provider says that it is safe. Get help right away if you have a lot of blood in your stool, nausea or vomiting, a fever, or increased pain in your abdomen. This information is not intended to replace advice given to you by your health care provider. Make sure you discuss any questions you have with your healthcare provider. Document Revised: 11/28/2019 Document Reviewed: 06/30/2019 Elsevier Patient Education  [place] After This sheet gives you information about how to care for yourself after your procedure. Your health care provider may also give you more specific instructions. If you have problems or questions, contact your health careprovider. What can I expect after the procedure? After the procedure, it is common to have: Tiredness. Forgetfulness about what happened after the procedure. Impaired judgment for important decisions. Nausea or vomiting. Some difficulty with balance. Follow these instructions at home: For the time period you were told by your health care provider:     Rest as needed. Do not participate in activities where you could fall or become injured. Do not drive or use machinery. Do not drink alcohol. Do not take sleeping pills or medicines that cause drowsiness. Do not make important decisions or sign legal documents. Do not take care of children on your own. Eating and drinking Follow the diet that is recommended by your health care provider. Drink enough fluid to keep your urine pale yellow. If you vomit: Drink water, juice, or soup when you can drink  without vomiting. Make sure you have little or no nausea before eating solid foods. General instructions Have a responsible adult stay with you for the time you are told. It is important to have someone help care for you until you are awake and alert. Take over-the-counter and prescription medicines only as told by your health care provider. If you have sleep apnea, surgery and certain medicines can increase your risk for breathing problems. Follow instructions from your health care provider about wearing your sleep device: Anytime you are sleeping, including during daytime naps. While taking prescription pain medicines, sleeping medicines, or medicines that make you drowsy. Avoid smoking. Keep all follow-up visits as told by your health care provider. This is important. Contact a health care provider if: You keep feeling nauseous or you keep vomiting. You feel light-headed. You are still sleepy or  having trouble with balance after 24 hours. You develop a rash. You have a fever. You have redness or swelling around the IV site. Get help right away if: You have trouble breathing. You have new-onset confusion at home. Summary For several hours after your procedure, you may feel tired. You may also be forgetful and have poor judgment. Have a responsible adult stay with you for the time you are told. It is important to have someone help care for you until you are awake and alert. Rest as told. Do not drive or operate machinery. Do not drink alcohol or take sleeping pills. Get help right away if you have trouble breathing, or if you suddenly become confused. This information is not intended to replace advice given to you by your health care provider. Make sure you discuss any questions you have with your healthcare provider. Document Revised: 08/19/2020 Document Reviewed: 11/06/2019 Elsevier Patient Education  2022 [employer].

## 2021-07-20 ENCOUNTER — Other Ambulatory Visit: Payer: Self-pay

## 2021-07-20 ENCOUNTER — Encounter (HOSPITAL_COMMUNITY)
Admission: RE | Admit: 2021-07-20 | Discharge: 2021-07-20 | Disposition: A | Discharge status: Home / Self Care | Payer: Medicaid Other | Source: Ambulatory Visit | Attending: Internal Medicine | Admitting: Internal Medicine

## 2021-07-20 ENCOUNTER — Encounter (HOSPITAL_COMMUNITY): Payer: Self-pay

## 2021-07-20 DIAGNOSIS — Z01818 Encounter for other preprocedural examination: Secondary | ICD-10-CM | POA: Diagnosis not present

## 2021-07-20 LAB — BASIC METABOLIC PANEL
Anion gap: 8 (ref 5–15)
BUN: 18 mg/dL (ref 6–20)
CO2: 23 mmol/L (ref 22–32)
Calcium: 9 mg/dL (ref 8.9–10.3)
Chloride: 106 mmol/L (ref 98–111)
Creatinine, Ser: 0.95 mg/dL (ref 0.61–1.24)
GFR, Estimated: >60 mL/min (ref >60)
Glucose, Bld: 77 mg/dL (ref 70–99)
Potassium: 3.5 mmol/L (ref 3.5–5.1)
Sodium: 137 mmol/L (ref 135–145)

## 2021-07-25 ENCOUNTER — Telehealth: Payer: Self-pay | Admitting: Internal Medicine

## 2021-07-25 ENCOUNTER — Encounter: Payer: Self-pay | Admitting: *Deleted

## 2021-07-25 NOTE — Telephone Encounter (Signed)
Called pt. He has been rescheduled to 9/12 at 8:45am. Aware will mail prep instructions with new pre-op appt. He already has prep at home. Called endo and made aware of change.

## 2021-07-25 NOTE — Telephone Encounter (Signed)
Pt called to say that he didn't go for his colonoscopy this morning because he didn't drink his prep. He is wanting to reschedule. [phone]

## 2021-08-24 NOTE — Patient Instructions (Signed)
Shane West  08/24/2021     @PREFPERIOPPHARMACY @   Your procedure is scheduled on  08/29/2021.   Report to 10/29/2021 at  0700  A.M.   Call this number if you have problems the morning of surgery:  [phone]   Remember:  Follow the diet and prep instructions given to you by the office.    Take these medicines the morning of surgery with A SIP OF WATER                                    celebrex     Do not wear jewelry, make-up or nail polish.  Do not wear lotions, powders, or perfumes, or deodorant.  Do not shave 48 hours prior to surgery.  Men may shave face and neck.  Do not bring valuables to the hospital.  [facility] is not responsible for any belongings or valuables.  Contacts, dentures or bridgework may not be worn into surgery.  Leave your suitcase in the car.  After surgery it may be brought to your room.  For patients admitted to the hospital, discharge time will be determined by your treatment team.  Patients discharged the day of surgery will not be allowed to drive home and must have someone with them for 24 hours.      Special instructions:   DO NOT smoke tobacco or vape for 24 hours before your procedure.  Please read over the following fact sheets that you were given. Anesthesia Post-op Instructions and Care and Recovery After Surgery      Colonoscopy, Adult, Care After This sheet gives you information about how to care for yourself after your procedure. Your health care provider may also give you more specific instructions. If you have problems or questions, contact your health care provider. What can I expect after the procedure? After the procedure, it is common to have: A small amount of blood in your stool for 24 hours after the procedure. Some gas. Mild cramping or bloating of your abdomen. Follow these instructions at home: Eating and drinking  Drink enough fluid to keep your urine pale yellow. Follow instructions from your  health care provider about eating or drinking restrictions. Resume your normal diet as instructed by your health care provider. Avoid heavy or fried foods that are hard to digest. Activity Rest as told by your health care provider. Avoid sitting for a long time without moving. Get up to take short walks every 1-2 hours. This is important to improve blood flow and breathing. Ask for help if you feel weak or unsteady. Return to your normal activities as told by your health care provider. Ask your health care provider what activities are safe for you. Managing cramping and bloating  Try walking around when you have cramps or feel bloated. Apply heat to your abdomen as told by your health care provider. Use the heat source that your health care provider recommends, such as a moist heat pack or a heating pad. Place a towel between your skin and the heat source. Leave the heat on for 20-30 minutes. Remove the heat if your skin turns bright red. This is especially important if you are unable to feel pain, heat, or cold. You may have a greater risk of getting burned. General instructions If you were given a sedative during the procedure, it can affect you for several hours.  Do not drive or operate machinery until your health care provider says that it is safe. For the first 24 hours after the procedure: Do not sign important documents. Do not drink alcohol. Do your regular daily activities at a slower pace than normal. Eat soft foods that are easy to digest. Take over-the-counter and prescription medicines only as told by your health care provider. Keep all follow-up visits as told by your health care provider. This is important. Contact a health care provider if: You have blood in your stool 2-3 days after the procedure. Get help right away if you have: More than a small spotting of blood in your stool. Large blood clots in your stool. Swelling of your abdomen. Nausea or vomiting. A  fever. Increasing pain in your abdomen that is not relieved with medicine. Summary After the procedure, it is common to have a small amount of blood in your stool. You may also have mild cramping and bloating of your abdomen. If you were given a sedative during the procedure, it can affect you for several hours. Do not drive or operate machinery until your health care provider says that it is safe. Get help right away if you have a lot of blood in your stool, nausea or vomiting, a fever, or increased pain in your abdomen. This information is not intended to replace advice given to you by your health care provider. Make sure you discuss any questions you have with your health care provider. Document Revised: 11/28/2019 Document Reviewed: 06/30/2019 Elsevier Patient Education  Shane West After This sheet gives you information about how to care for yourself after your procedure. Your health care provider may also give you more specific instructions. If you have problems or questions, contact your health care provider. What can I expect after the procedure? After the procedure, it is common to have: Tiredness. Forgetfulness about what happened after the procedure. Impaired judgment for important decisions. Nausea or vomiting. Some difficulty with balance. Follow these instructions at home: For the time period you were told by your health care provider:   Rest as needed. Do not participate in activities where you could fall or become injured. Do not drive or use machinery. Do not drink alcohol. Do not take sleeping pills or medicines that cause drowsiness. Do not make important decisions or sign legal documents. Do not take care of children on your own. Eating and drinking Follow the diet that is recommended by your health care provider. Drink enough fluid to keep your urine pale yellow. If you vomit: Drink water, juice, or soup when you can drink  without vomiting. Make sure you have little or no nausea before eating solid foods. General instructions Have a responsible adult stay with you for the time you are told. It is important to have someone help care for you until you are awake and alert. Take over-the-counter and prescription medicines only as told by your health care provider. If you have sleep apnea, surgery and certain medicines can increase your risk for breathing problems. Follow instructions from your health care provider about wearing your sleep device: Anytime you are sleeping, including during daytime naps. While taking prescription pain medicines, sleeping medicines, or medicines that make you drowsy. Avoid smoking. Keep all follow-up visits as told by your health care provider. This is important. Contact a health care provider if: You keep feeling nauseous or you keep vomiting. You feel light-headed. You are still sleepy or having trouble with balance  after 24 hours. You develop a rash. You have a fever. You have redness or swelling around the IV site. Get help right away if: You have trouble breathing. You have new-onset confusion at home. Summary For several hours after your procedure, you may feel tired. You may also be forgetful and have poor judgment. Have a responsible adult stay with you for the time you are told. It is important to have someone help care for you until you are awake and alert. Rest as told. Do not drive or operate machinery. Do not drink alcohol or take sleeping pills. Get help right away if you have trouble breathing, or if you suddenly become confused. This information is not intended to replace advice given to you by your health care provider. Make sure you discuss any questions you have with your health care provider. Document Revised: 08/19/2020 Document Reviewed: 11/06/2019 Elsevier Patient Education  2022 [employer].

## 2021-08-26 ENCOUNTER — Encounter: Payer: Self-pay | Admitting: *Deleted

## 2021-08-26 ENCOUNTER — Telehealth: Payer: Self-pay | Admitting: Internal Medicine

## 2021-08-26 ENCOUNTER — Encounter (HOSPITAL_COMMUNITY): Payer: Medicaid Other

## 2021-08-26 ENCOUNTER — Encounter (HOSPITAL_COMMUNITY)
Admission: RE | Admit: 2021-08-26 | Discharge: 2021-08-26 | Disposition: A | Discharge status: Home / Self Care | Payer: Medicaid Other | Source: Ambulatory Visit | Attending: Internal Medicine | Admitting: Internal Medicine

## 2021-08-26 NOTE — Telephone Encounter (Signed)
Pt called to reschedule his procedure on Monday 9/12 with Dr Marletta Lor. [phone]

## 2021-08-26 NOTE — Telephone Encounter (Signed)
Shane West,   I called Mr Musson to review his pre op instructions and he said he needs to reschedule for another day.  I let Eber Jones know.  He said he would call the office to reschedule.   Cliffton Asters RN  ---------------------------------------------  Called pt. He has been rescheduled to 10/3 at 8:45am. Aware will mail new instructions with new pre-op appt. Confirmed address. He states he already has his prep. He is also aware if he can't make this appt he will need OV again to r/s.

## 2021-09-14 NOTE — Patient Instructions (Signed)
Shane West  09/14/2021     @PREFPERIOPPHARMACY @   Your procedure is scheduled on 09/19/2021.   Report to 11/19/2021 at  0700 A.M.   Call this number if you have problems the morning of surgery:  [phone]   Remember:  Follow the diet and prep instructions given to you by the office.    Take these medicines the morning of surgery with A SIP OF WATER                            celebrex.     Do not wear jewelry, make-up or nail polish.  Do not wear lotions, powders, or perfumes, or deodorant.  Do not shave 48 hours prior to surgery.  Men may shave face and neck.  Do not bring valuables to the hospital.  [facility] is not responsible for any belongings or valuables.  Contacts, dentures or bridgework may not be worn into surgery.  Leave your suitcase in the car.  After surgery it may be brought to your room.  For patients admitted to the hospital, discharge time will be determined by your treatment team.  Patients discharged the day of surgery will not be allowed to drive home and must  have someone with them for 24 hours.    Special instructions:   DO NOT smoke tobacco or vape for 24 hours before your procedure.  Please read over the following fact sheets that you were given. Anesthesia Post-op Instructions and Care and Recovery After Surgery      Colonoscopy, Adult, Care After This sheet gives you information about how to care for yourself after your procedure. Your health care provider may also give you more specific instructions. If you have problems or questions, contact your health care provider. What can I expect after the procedure? After the procedure, it is common to have: A small amount of blood in your stool for 24 hours after the procedure. Some gas. Mild cramping or bloating of your abdomen. Follow these instructions at home: Eating and drinking  Drink enough fluid to keep your urine pale yellow. Follow instructions from your health care  provider about eating or drinking restrictions. Resume your normal diet as instructed by your health care provider. Avoid heavy or fried foods that are hard to digest. Activity Rest as told by your health care provider. Avoid sitting for a long time without moving. Get up to take short walks every 1-2 hours. This is important to improve blood flow and breathing. Ask for help if you feel weak or unsteady. Return to your normal activities as told by your health care provider. Ask your health care provider what activities are safe for you. Managing cramping and bloating  Try walking around when you have cramps or feel bloated. Apply heat to your abdomen as told by your health care provider. Use the heat source that your health care provider recommends, such as a moist heat pack or a heating pad. Place a towel between your skin and the heat source. Leave the heat on for 20-30 minutes. Remove the heat if your skin turns bright red. This is especially important if you are unable to feel pain, heat, or cold. You may have a greater risk of getting burned. General instructions If you were given a sedative during the procedure, it can affect you for several hours. Do not drive or operate machinery until your health care provider says  that it is safe. For the first 24 hours after the procedure: Do not sign important documents. Do not drink alcohol. Do your regular daily activities at a slower pace than normal. Eat soft foods that are easy to digest. Take over-the-counter and prescription medicines only as told by your health care provider. Keep all follow-up visits as told by your health care provider. This is important. Contact a health care provider if: You have blood in your stool 2-3 days after the procedure. Get help right away if you have: More than a small spotting of blood in your stool. Large blood clots in your stool. Swelling of your abdomen. Nausea or vomiting. A fever. Increasing pain  in your abdomen that is not relieved with medicine. Summary After the procedure, it is common to have a small amount of blood in your stool. You may also have mild cramping and bloating of your abdomen. If you were given a sedative during the procedure, it can affect you for several hours. Do not drive or operate machinery until your health care provider says that it is safe. Get help right away if you have a lot of blood in your stool, nausea or vomiting, a fever, or increased pain in your abdomen. This information is not intended to replace advice given to you by your health care provider. Make sure you discuss any questions you have with your health care provider. Document Revised: 11/28/2019 Document Reviewed: 06/30/2019 Elsevier Patient Education  2022 [employer] Monitored Anesthesia Care, Care After This sheet gives you information about how to care for yourself after your procedure. Your health care provider may also give you more specific instructions. If you have problems or questions, contact your health care provider. What can I expect after the procedure? After the procedure, it is common to have: Tiredness. Forgetfulness about what happened after the procedure. Impaired judgment for important decisions. Nausea or vomiting. Some difficulty with balance. Follow these instructions at home: For the time period you were told by your health care provider:   Rest as needed. Do not participate in activities where you could fall or become injured. Do not drive or use machinery. Do not drink alcohol. Do not take sleeping pills or medicines that cause drowsiness. Do not make important decisions or sign legal documents. Do not take care of children on your own. Eating and drinking Follow the diet that is recommended by your health care provider. Drink enough fluid to keep your urine pale yellow. If you vomit: Drink water, juice, or soup when you can drink without vomiting. Make  sure you have little or no nausea before eating solid foods. General instructions Have a responsible adult stay with you for the time you are told. It is important to have someone help care for you until you are awake and alert. Take over-the-counter and prescription medicines only as told by your health care provider. If you have sleep apnea, surgery and certain medicines can increase your risk for breathing problems. Follow instructions from your health care provider about wearing your sleep device: Anytime you are sleeping, including during daytime naps. While taking prescription pain medicines, sleeping medicines, or medicines that make you drowsy. Avoid smoking. Keep all follow-up visits as told by your health care provider. This is important. Contact a health care provider if: You keep feeling nauseous or you keep vomiting. You feel light-headed. You are still sleepy or having trouble with balance after 24 hours. You develop a rash. You have a fever. You  have redness or swelling around the IV site. Get help right away if: You have trouble breathing. You have new-onset confusion at home. Summary For several hours after your procedure, you may feel tired. You may also be forgetful and have poor judgment. Have a responsible adult stay with you for the time you are told. It is important to have someone help care for you until you are awake and alert. Rest as told. Do not drive or operate machinery. Do not drink alcohol or take sleeping pills. Get help right away if you have trouble breathing, or if you suddenly become confused. This information is not intended to replace advice given to you by your health care provider. Make sure you discuss any questions you have with your health care provider. Document Revised: 08/19/2020 Document Reviewed: 11/06/2019 Elsevier Patient Education  2022 [employer].

## 2021-09-15 ENCOUNTER — Encounter (HOSPITAL_COMMUNITY)
Admission: RE | Admit: 2021-09-15 | Discharge: 2021-09-15 | Disposition: A | Discharge status: Home / Self Care | Payer: Medicaid Other | Source: Ambulatory Visit | Attending: Internal Medicine | Admitting: Internal Medicine

## 2021-09-15 ENCOUNTER — Telehealth: Payer: Self-pay | Admitting: *Deleted

## 2021-09-15 NOTE — Pre-Procedure Instructions (Signed)
Interoffice message sent to Mindy @ [facility] to let her know patient did not show for his PAT.

## 2021-09-15 NOTE — Telephone Encounter (Signed)
Called pt and he stated he did not realize it was today.  Called endo and pt can go tomorrow at 1:00pm. Called pt and made aware. He voiced understanding

## 2021-09-15 NOTE — Telephone Encounter (Signed)
-----   Message from Elsie Amis, RN sent at 09/15/2021  1:52 PM EDT ----- Regarding: no show Hey girl! Shane West did not show for his preop today.

## 2021-09-16 ENCOUNTER — Encounter (HOSPITAL_COMMUNITY): Payer: Self-pay

## 2021-09-16 ENCOUNTER — Encounter (HOSPITAL_COMMUNITY)
Admission: RE | Admit: 2021-09-16 | Discharge: 2021-09-16 | Disposition: A | Discharge status: Home / Self Care | Payer: Medicaid Other | Source: Ambulatory Visit | Attending: Internal Medicine | Admitting: Internal Medicine

## 2021-09-16 NOTE — Pre-Procedure Instructions (Signed)
We have attempted to call patient at [phone] as well as his contact person, Kelle Darting at [phone] to go over preoperative information concerning procedure on 09/19/2021. We have left messages at both numbers which are the only numbers listed in the chart.

## 2021-09-19 ENCOUNTER — Encounter (HOSPITAL_COMMUNITY): Admission: RE | Payer: Self-pay | Source: Home / Self Care

## 2021-09-19 ENCOUNTER — Ambulatory Visit (HOSPITAL_COMMUNITY): Admission: RE | Admit: 2021-09-19 | Payer: Medicaid Other | Source: Home / Self Care

## 2021-09-19 SURGERY — COLONOSCOPY WITH PROPOFOL
Anesthesia: Monitor Anesthesia Care

## 2021-09-20 NOTE — OR Nursing (Signed)
Patient was on the schedule to have colonoscopy with propofol. He did not show up for his procedure.
